# Patient Record
Sex: Male | Born: 1985 | Hispanic: No | Marital: Married | Smoking: Current every day smoker
Health system: Southern US, Community
[De-identification: ages and names within clinical notes are randomized; demographics above are authoritative.]

## PROBLEM LIST (undated history)

## (undated) HISTORY — PX: KNEE SURGERY: SHX244

---

## 2013-02-17 ENCOUNTER — Emergency Department: Payer: Self-pay | Admitting: Emergency Medicine

## 2013-02-17 LAB — BASIC METABOLIC PANEL
Anion Gap: 3 — ABNORMAL LOW (ref 7–16)
Calcium, Total: 8.5 mg/dL (ref 8.5–10.1)
Co2: 28 mmol/L (ref 21–32)
EGFR (African American): >60
EGFR (Non-African Amer.): >60
Glucose: 98 mg/dL (ref 65–99)
Osmolality: 282 (ref 275–301)
Potassium: 3.8 mmol/L (ref 3.5–5.1)

## 2018-04-19 ENCOUNTER — Other Ambulatory Visit: Payer: Self-pay

## 2018-04-19 ENCOUNTER — Ambulatory Visit
Admission: EM | Admit: 2018-04-19 | Discharge: 2018-04-19 | Disposition: A | Discharge status: Home / Self Care | Payer: BLUE CROSS/BLUE SHIELD | Attending: Family Medicine | Admitting: Family Medicine

## 2018-04-19 ENCOUNTER — Encounter: Payer: Self-pay | Admitting: Emergency Medicine

## 2018-04-19 DIAGNOSIS — R509 Fever, unspecified: Secondary | ICD-10-CM | POA: Diagnosis not present

## 2018-04-19 DIAGNOSIS — R05 Cough: Secondary | ICD-10-CM | POA: Diagnosis not present

## 2018-04-19 DIAGNOSIS — R69 Illness, unspecified: Principal | ICD-10-CM

## 2018-04-19 DIAGNOSIS — J111 Influenza due to unidentified influenza virus with other respiratory manifestations: Secondary | ICD-10-CM

## 2018-04-19 DIAGNOSIS — Z720 Tobacco use: Secondary | ICD-10-CM | POA: Diagnosis not present

## 2018-04-19 LAB — RAPID INFLUENZA A&B ANTIGENS
Influenza A (ARMC): NEGATIVE
Influenza B (ARMC): NEGATIVE

## 2018-04-19 MED ORDER — OSELTAMIVIR PHOSPHATE 75 MG PO CAPS: 75.0000 mg | ORAL_CAPSULE | Freq: Two times a day (BID) | ORAL | 0 refills | Status: AC

## 2018-04-19 NOTE — ED Provider Notes (Signed)
[facility]    CSN: [id number] Arrival date & time: 04/19/18  1157  History   Chief Complaint Chief Complaint  Patient presents with  . Cough  . Fever  . Generalized Body Aches   HPI  33 year old Lee [name] presents with the above complaints.  Patient states symptoms started abruptly this morning.  He reports cough, subjective fever, chills, body aches.  Reports abdominal pain as well.  Patient is concerned that he has influenza.  He has the same symptoms as his son.  No medications or interventions tried.  No known exacerbating factors.  No other associated symptoms.  No other complaints.  PMH, Surgical Hx, Family Hx, Social History reviewed and updated as below.  PMH: ACL tear  Past Surgical History:  Procedure Laterality Date  . KNEE SURGERY Right     Home Medications    Prior to Admission medications   Medication Sig Start Date End Date Taking? Authorizing Provider  oseltamivir (TAMIFLU) 75 MG capsule Take 1 capsule (75 mg total) by mouth every 12 (twelve) hours. 04/19/18   Tommie Sams, DO    Family History Family History  Problem Relation Age of Onset  . Diabetes Mother   . Healthy Father     Social History Social History   Tobacco Use  . Smoking status: Current Every Day Smoker    Types: Cigarettes  . Smokeless tobacco: Never Used  Substance Use Topics  . Alcohol use: Yes  . Drug use: Never     Allergies   Patient has no known allergies.   Review of Systems Review of Systems Per HPI  Physical Exam Triage Vital Signs ED Triage Vitals  Enc Vitals Group     BP 04/19/18 1217 125/84     Pulse Rate 04/19/18 1217 99     Resp 04/19/18 1217 16     Temp 04/19/18 1217 98.6 F (37 C)     Temp Source 04/19/18 1217 Oral     SpO2 04/19/18 1217 97 %     Weight 04/19/18 1213 140 lb (63.5 kg)     Height 04/19/18 1213 5\' 7"  (1.702 m)     Head Circumference --      Peak Flow --      Pain Score 04/19/18 1213 6     Pain Loc --      Pain Edu? --       Excl. in GC? --    Updated Vital Signs BP 125/84 (BP Location: Left Arm)   Pulse 99   Temp 98.6 F (37 C) (Oral)   Resp 16   Ht 5\' 7"  (1.702 m)   Wt 63.5 kg   SpO2 97%   BMI 21.93 kg/m   Visual Acuity Right Eye Distance:   Left Eye Distance:   Bilateral Distance:    Right Eye Near:   Left Eye Near:    Bilateral Near:     Physical Exam Vitals signs and nursing note reviewed.  Constitutional:      General: He is not in acute distress.    Appearance: Normal appearance.  HENT:     Head: Normocephalic and atraumatic.     Right Ear: Tympanic membrane normal.     Left Ear: Tympanic membrane normal.     Mouth/Throat:     Pharynx: Oropharynx is clear. No posterior oropharyngeal erythema.  Eyes:     General:        Right eye: No discharge.        Left  eye: No discharge.     Conjunctiva/sclera: Conjunctivae normal.  Cardiovascular:     Rate and Rhythm: Normal rate and regular rhythm.  Pulmonary:     Effort: Pulmonary effort is normal.     Breath sounds: Normal breath sounds.  Neurological:     Mental Status: He is alert.  Psychiatric:        Mood and Affect: Mood normal.        Behavior: Behavior normal.    UC Treatments / Results  Labs (all labs ordered are listed, but only abnormal results are displayed) Labs Reviewed  RAPID INFLUENZA A&B ANTIGENS ([facility] ONLY)    EKG None  Radiology No results found.  Procedures Procedures (including critical care time)  Medications Ordered in UC Medications - No data to display  Initial Impression / Assessment and Plan / UC Course  I have reviewed the triage vital signs and the nursing notes.  Pertinent labs & imaging results that were available during my care of the patient were reviewed by me and considered in my medical decision making (see chart for details).    33 year old Lee [name] presents with influenza-like illness.  Treating with Tamiflu.  Final Clinical Impressions(s) / UC Diagnoses   Final diagnoses:    Influenza-like illness     Discharge Instructions     Rest. Fluids.   Medication as prescribed.  Take care   Dr. Adriana Simas    ED Prescriptions    Medication Sig Dispense Auth. Provider   oseltamivir (TAMIFLU) 75 MG capsule Take 1 capsule (75 mg total) by mouth every 12 (twelve) hours. 10 capsule Tommie Sams, DO     Controlled Substance Prescriptions [place] Controlled Substance Registry consulted? Not Applicable   Tommie Sams, DO 04/19/18 1457

## 2018-04-19 NOTE — ED Triage Notes (Addendum)
Patient c/o cough, fever, and bodyaches that started early this morning.

## 2018-04-19 NOTE — Discharge Instructions (Signed)
Rest. Fluids.  Medication as prescribed.   Take care  Dr. Aileen Amore  

## 2018-04-22 ENCOUNTER — Encounter: Payer: Self-pay | Admitting: Emergency Medicine

## 2018-04-22 ENCOUNTER — Other Ambulatory Visit: Payer: Self-pay

## 2018-04-22 ENCOUNTER — Ambulatory Visit
Admission: EM | Admit: 2018-04-22 | Discharge: 2018-04-22 | Disposition: A | Discharge status: Home / Self Care | Payer: BLUE CROSS/BLUE SHIELD | Attending: Family Medicine | Admitting: Family Medicine

## 2018-04-22 DIAGNOSIS — L739 Follicular disorder, unspecified: Secondary | ICD-10-CM | POA: Diagnosis not present

## 2018-04-22 DIAGNOSIS — M791 Myalgia, unspecified site: Secondary | ICD-10-CM

## 2018-04-22 MED ORDER — CYCLOBENZAPRINE HCL 10 MG PO TABS: 10.0000 mg | ORAL_TABLET | Freq: Every day | ORAL | 0 refills | Status: AC

## 2018-04-22 MED ORDER — AMOXICILLIN 875 MG PO TABS: 875.0000 mg | ORAL_TABLET | Freq: Two times a day (BID) | ORAL | 0 refills | Status: AC

## 2018-04-22 NOTE — ED Provider Notes (Signed)
[facility]    CSN: [id number] Arrival date & time: 04/22/18  1043     History   Chief Complaint Chief Complaint  Patient presents with  . Rash    appt  . Back Pain    HPI Lee [name] is a 33 y.o. male.   33 yo male with a c/o rash to arms and legs that he noticed today. Denies any pain or itching.  Also c/o low back and mid back pain. Denies any injuries, falls.   The history is provided by the patient.  Rash  Back Pain    History reviewed. No pertinent past medical history.  There are no active problems to display for this patient.   Past Surgical History:  Procedure Laterality Date  . KNEE SURGERY Right        Home Medications    Prior to Admission medications   Medication Sig Start Date End Date Taking? Authorizing Provider  oseltamivir (TAMIFLU) 75 MG capsule Take 1 capsule (75 mg total) by mouth every 12 (twelve) hours. 04/19/18  Yes Cook, Jayce G, DO  amoxicillin (AMOXIL) 875 MG tablet Take 1 tablet (875 mg total) by mouth 2 (two) times daily. 04/22/18   Payton Mccallum, MD  cyclobenzaprine (FLEXERIL) 10 MG tablet Take 1 tablet (10 mg total) by mouth at bedtime. 04/22/18   Payton Mccallum, MD    Family History Family History  Problem Relation Age of Onset  . Diabetes Mother   . Healthy Father     Social History Social History   Tobacco Use  . Smoking status: Current Every Day Smoker    Types: Cigarettes  . Smokeless tobacco: Never Used  Substance Use Topics  . Alcohol use: Yes  . Drug use: Never     Allergies   Patient has no known allergies.   Review of Systems Review of Systems  Musculoskeletal: Positive for back pain.  Skin: Positive for rash.     Physical Exam Triage Vital Signs ED Triage Vitals  Enc Vitals Group     BP 04/22/18 1058 126/86     Pulse Rate 04/22/18 1058 77     Resp 04/22/18 1058 18     Temp 04/22/18 1058 98 F (36.7 C)     Temp Source 04/22/18 1058 Oral     SpO2 04/22/18 1058 100 %   Weight 04/22/18 1055 140 lb (63.5 kg)     Height 04/22/18 1055 5\' 6"  (1.676 m)     Head Circumference --      Peak Flow --      Pain Score 04/22/18 1054 8     Pain Loc --      Pain Edu? --      Excl. in GC? --    No data found.  Updated Vital Signs BP 126/86 (BP Location: Left Arm)   Pulse 77   Temp 98 F (36.7 C) (Oral)   Resp 18   Ht 5\' 6"  (1.676 m)   Wt 63.5 kg   SpO2 100%   BMI 22.60 kg/m   Visual Acuity Right Eye Distance:   Left Eye Distance:   Bilateral Distance:    Right Eye Near:   Left Eye Near:    Bilateral Near:     Physical Exam Vitals signs and nursing note reviewed.  Constitutional:      General: He is not in acute distress.    Appearance: He is not toxic-appearing or diaphoretic.  Musculoskeletal:     Thoracic back:  He exhibits tenderness (paraspinous muscles) and spasm.     Lumbar back: He exhibits tenderness (paraspinous muscles) and spasm.  Skin:    Findings: Rash present. Rash is papular and pustular.     Comments: Upper and lower extremities  Neurological:     Mental Status: He is alert.      UC Treatments / Results  Labs (all labs ordered are listed, but only abnormal results are displayed) Labs Reviewed - No data to display  EKG None  Radiology No results found.  Procedures Procedures (including critical care time)  Medications Ordered in UC Medications - No data to display  Initial Impression / Assessment and Plan / UC Course  I have reviewed the triage vital signs and the nursing notes.  Pertinent labs & imaging results that were available during my care of the patient were reviewed by me and considered in my medical decision making (see chart for details).      Final Clinical Impressions(s) / UC Diagnoses   Final diagnoses:  Myalgia  Folliculitis   Discharge Instructions   None    ED Prescriptions    Medication Sig Dispense Auth. Provider   cyclobenzaprine (FLEXERIL) 10 MG tablet Take 1 tablet (10 mg  total) by mouth at bedtime. 30 tablet Payton Mccallum, MD   amoxicillin (AMOXIL) 875 MG tablet Take 1 tablet (875 mg total) by mouth 2 (two) times daily. 14 tablet Analeya Luallen, Pamala Hurry, MD     1. diagnosis reviewed with patient 2. rx as per orders above; reviewed possible side effects, interactions, risks and benefits  3. Recommend supportive treatment with rest, fluids, heat/ice, otc analgesics prn  4. Follow-up prn if symptoms worsen or don't improve   Controlled Substance Prescriptions [place] Controlled Substance Registry consulted? Not Applicable   Payton Mccallum, MD 04/22/18 1250

## 2018-04-22 NOTE — ED Triage Notes (Signed)
Pt c/o rash on his bilateral knees, left elbow and back pain. Rash started today when he got out of the shower and the back pain started 2-3 days ago but is getting worse. He states his back feels like a burning sensation. He has not changed any thing other than started taking Tamiflu on 04/19/18.

## 2019-09-13 ENCOUNTER — Ambulatory Visit (INDEPENDENT_AMBULATORY_CARE_PROVIDER_SITE_OTHER): Payer: Worker's Compensation

## 2019-09-13 ENCOUNTER — Other Ambulatory Visit: Payer: Self-pay

## 2019-09-13 ENCOUNTER — Ambulatory Visit
Admission: EM | Admit: 2019-09-13 | Discharge: 2019-09-13 | Disposition: A | Discharge status: Home / Self Care | Payer: Self-pay | Attending: Family Medicine | Admitting: Family Medicine

## 2019-09-13 DIAGNOSIS — M25571 Pain in right ankle and joints of right foot: Secondary | ICD-10-CM

## 2019-09-13 DIAGNOSIS — S93491A Sprain of other ligament of right ankle, initial encounter: Secondary | ICD-10-CM

## 2019-09-13 NOTE — Discharge Instructions (Addendum)
Over-the-counter ibuprofen.  Use splint.  Ice.  Elevate.  Use crutches and splint until follow-up with occupational health in 3 days.  See above to call to schedule.  Follow up with your primary care physician this week as needed. Return to Urgent care for new or worsening concerns.

## 2019-09-13 NOTE — ED Provider Notes (Signed)
[facility] ____________________________________________  Time seen: Approximately 12:51 PM  I have reviewed the triage vital signs and the nursing notes.   HISTORY  Chief Complaint Work Related Injury and Ankle Pain   HPI Lee [name] is a 34 y.o. male presenting for evaluation of right ankle pain post injury.  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  Patient reports that this morning around 7 AM he was at work unloading a pallet at [employer].  States his right foot was planted flat and remained still as he pivoted to his left after bending, and states he felt and heard a pop with onset of right ankle pain that has persisted since.  States he is able to weight-bear but with pain.  Denies pain radiation, paresthesias or other injury.  Reports has had swelling located since.  Has not take any over-the-counter medication prior to arrival.  Denies other alleviating measures.  Pain is worse with activity and direct palpation.  Denies injury to the same area.  Does not currently follow with orthopedic.  Reports otherwise doing well.  History reviewed. No pertinent past medical history.  There are no problems to display for this patient.   Past Surgical History:  Procedure Laterality Date   KNEE SURGERY Right      No current facility-administered medications for this encounter.  Current Outpatient Medications:    amoxicillin (AMOXIL) 875 MG tablet, Take 1 tablet (875 mg total) by mouth 2 (two) times daily., Disp: 14 tablet, Rfl: 0   cyclobenzaprine (FLEXERIL) 10 MG tablet, Take 1 tablet (10 mg total) by mouth at bedtime., Disp: 30 tablet, Rfl: 0   oseltamivir (TAMIFLU) 75 MG capsule, Take 1 capsule (75 mg total) by mouth every 12 (twelve) hours., Disp: 10 capsule, Rfl: 0  Allergies Oxycodone-acetaminophen, Acellular pertussis, Anesthesia [id number] [propofol], and Other  Family History  Problem Relation Age of Onset   Diabetes Mother    Healthy Father      Social History Social History   Tobacco Use   Smoking status: Current Every Day Smoker    Packs/day: 0.50    Types: Cigarettes   Smokeless tobacco: Never Used  Vaping Use   Vaping Use: Never used  Substance Use Topics   Alcohol use: Yes    Comment: occasionally   Drug use: Never    Review of Systems Constitutional: No fever ENT: No sore throat. Cardiovascular: Denies chest pain. Respiratory: Denies shortness of breath. Gastrointestinal: No abdominal pain. Musculoskeletal: Positive right ankle pain.  Skin: Negative for rash. Neurological: Negative for focal weakness or numbness.   ____________________________________________   PHYSICAL EXAM:  VITAL SIGNS: ED Triage Vitals  Enc Vitals Group     BP 09/13/19 1136 126/88     Pulse Rate 09/13/19 1136 60     Resp 09/13/19 1136 16     Temp 09/13/19 1136 98.3 F (36.8 C)     Temp Source 09/13/19 1136 Oral     SpO2 09/13/19 1136 100 %     Weight 09/13/19 1133 145 lb (65.8 kg)     Height 09/13/19 1133 5\' 7"  (1.702 m)     Head Circumference --      Peak Flow --      Pain Score 09/13/19 1132 7     Pain Loc --      Pain Edu? --      Excl. in GC? --     Constitutional: Alert and oriented. Well appearing and in no acute distress. Eyes: Conjunctivae are normal.  ENT      Head: Normocephalic and atraumatic. Cardiovascular: Good peripheral circulation. Respiratory: Normal respiratory effort without tachypnea nor retractions. Musculoskeletal:  Bilateral pedal pulses equal and easily palpated. Except: Right lateral malleolus mild tenderness to direct palpation, tenderness along right ATFL with localized swelling and tenderness, mild pain with plantarflexion and dorsiflexion, pain with ankle rotation, right lower extremity otherwise nontender, normal to sensation, refill to right foot. Neurologic:  Normal speech and language.  Skin:  Skin is warm, dry and intact. No rash noted. Psychiatric: Mood and affect are  normal. Speech and behavior are normal. Patient exhibits appropriate insight and judgment   ___________________________________________   LABS (all labs ordered are listed, but only abnormal results are displayed)  Labs Reviewed - No data to display  RADIOLOGY  DG Ankle Complete Right  Result Date: 09/13/2019 CLINICAL DATA:  Twist injury.  Lateral pain. EXAM: RIGHT ANKLE - COMPLETE 3+ VIEW COMPARISON:  None. FINDINGS: Mild soft tissue swelling along the lateral malleolus. Right ankle is located without a fracture. Normal alignment. IMPRESSION: Mild lateral soft tissue swelling.  No acute bone abnormality. Electronically Signed   By: Richarda Overlie M.D.   On: 09/13/2019 12:38   ____________________________________________   PROCEDURES Procedures     INITIAL IMPRESSION / ASSESSMENT AND PLAN / ED COURSE  Pertinent labs & imaging results that were available during my care of the patient were reviewed by me and considered in my medical decision making (see chart for details).  Well-appearing patient.  Right ankle pain post mechanical injury.  Right ankle x-ray as above, no acute bony changes, concern for sprain versus ligamentous injury.  Encourage rest, brace and crutches, supportive care.  Over-the-counter ibuprofen.  At this is Worker's Compensation injury, recommend follow-up with occupational health in 3 days to ensure improving.  Information given.  Discussed follow up and return parameters including no resolution or any worsening concerns. Patient verbalized understanding and agreed to plan.   ____________________________________________   FINAL CLINICAL IMPRESSION(S) / ED DIAGNOSES  Final diagnoses:  Acute right ankle pain  Sprain of anterior talofibular ligament of right ankle, initial encounter     ED Discharge Orders    None       Note: This dictation was prepared with Dragon dictation along with smaller phrase technology. Any transcriptional errors that result from  this process are unintentional.         Renford Dills, NP 09/13/19 1319

## 2019-09-13 NOTE — ED Triage Notes (Signed)
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

## 2021-12-11 IMAGING — CR DG ANKLE COMPLETE 3+V*R*
3 series · 3 of 3 positions shown · non-contrast
Comparison: None.

CLINICAL DATA: Twist injury.  Lateral pain.

EXAM:
RIGHT ANKLE - COMPLETE 3+ VIEW

[ankle ap]
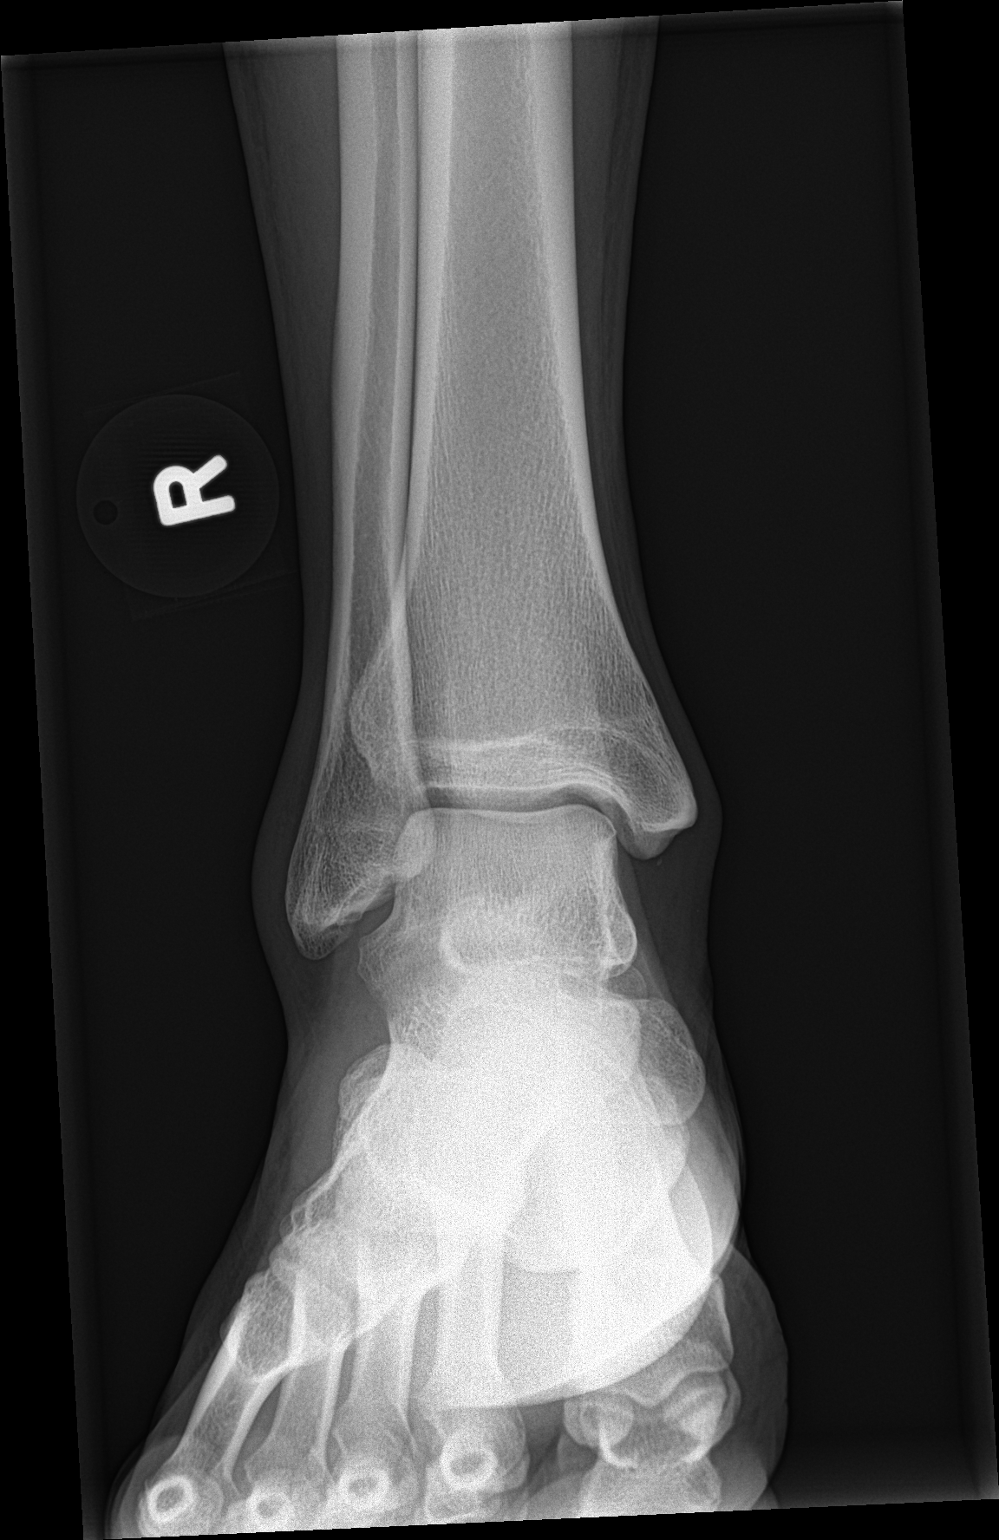

[ankle obl]
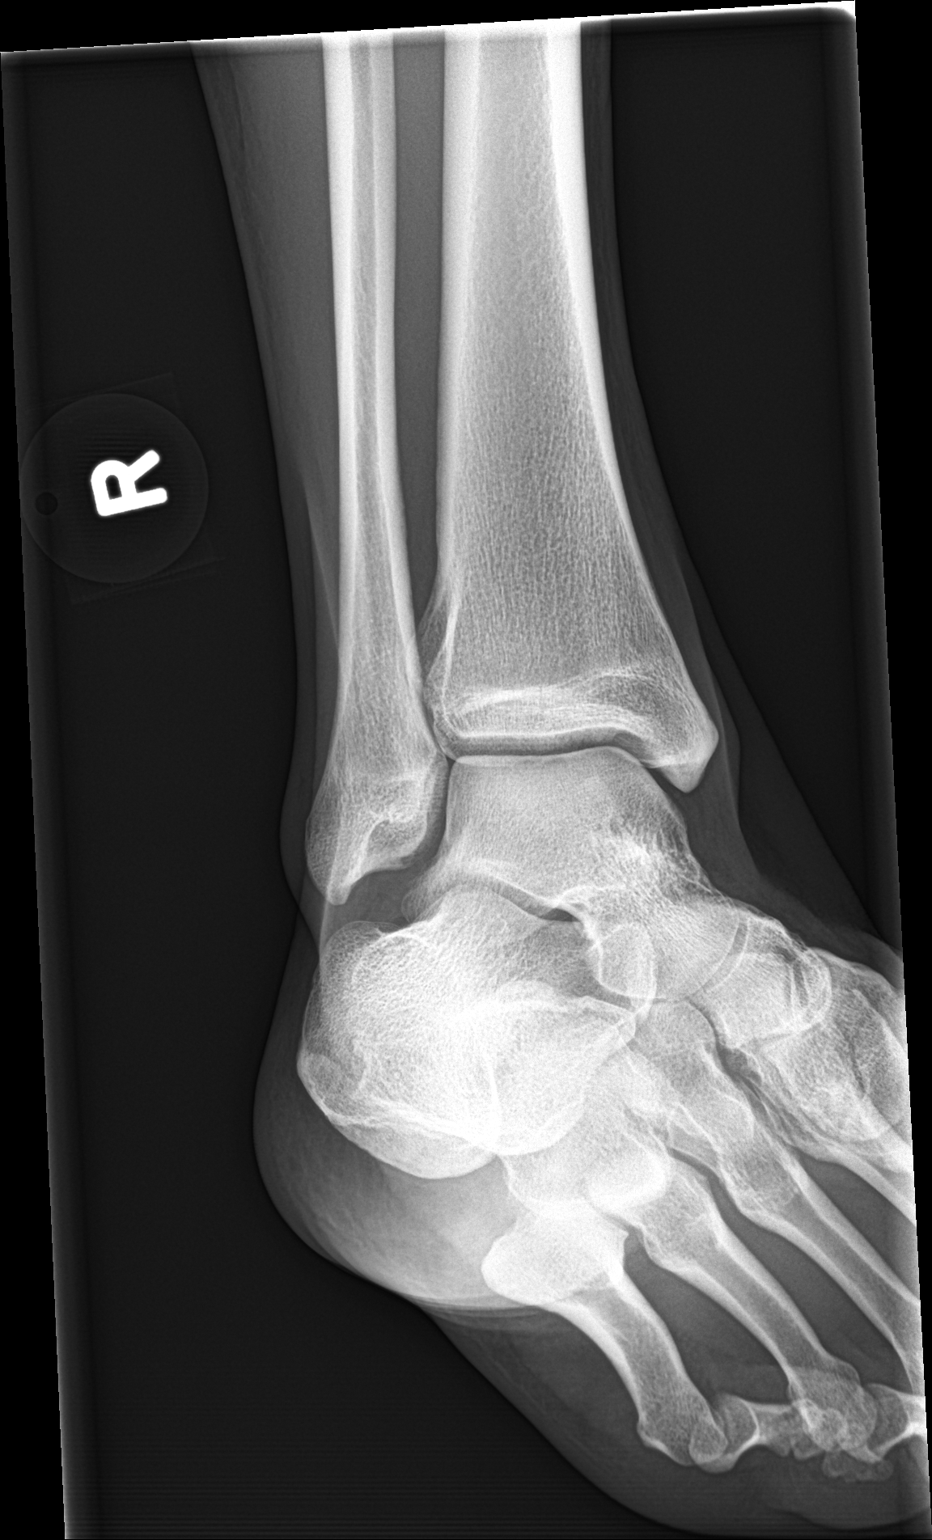

[ankle lat]
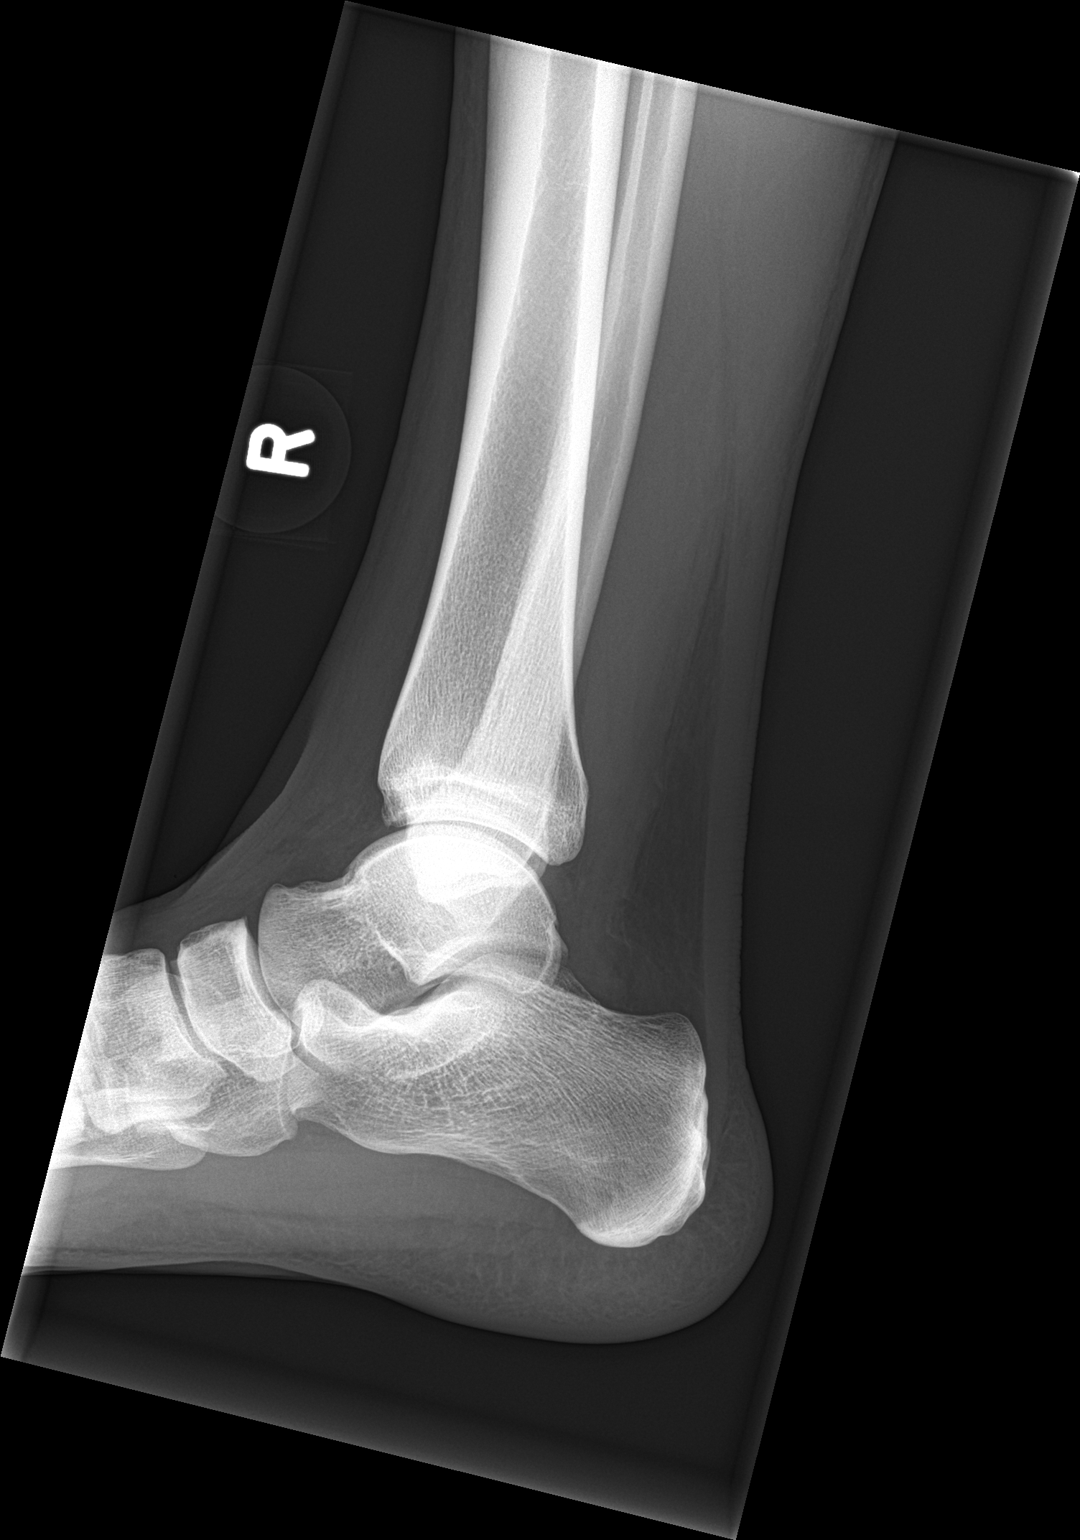

[3 of 3 positions shown; findings below may reference images not displayed]

FINDINGS: Mild soft tissue swelling along the lateral malleolus. Right ankle
is located without a fracture. Normal alignment.
IMPRESSION: Mild lateral soft tissue swelling.  No acute bone abnormality.
